# Patient Record
Sex: Female | Born: 1985 | Race: Black or African American | Hispanic: No | Marital: Single | Smoking: Never smoker
Health system: Southern US, Community
[De-identification: ages and names within clinical notes are randomized; demographics above are authoritative.]

## PROBLEM LIST (undated history)

## (undated) HISTORY — PX: LEEP: SHX91

---

## 2013-08-29 ENCOUNTER — Emergency Department (HOSPITAL_BASED_OUTPATIENT_CLINIC_OR_DEPARTMENT_OTHER)
Admission: EM | Admit: 2013-08-29 | Discharge: 2013-08-29 | Disposition: A | Discharge status: Home / Self Care | Payer: Medicaid Other | Attending: Emergency Medicine | Admitting: Emergency Medicine

## 2013-08-29 ENCOUNTER — Encounter (HOSPITAL_BASED_OUTPATIENT_CLINIC_OR_DEPARTMENT_OTHER): Payer: Self-pay | Admitting: Emergency Medicine

## 2013-08-29 DIAGNOSIS — R1032 Left lower quadrant pain: Secondary | ICD-10-CM | POA: Insufficient documentation

## 2013-08-29 DIAGNOSIS — Z3202 Encounter for pregnancy test, result negative: Secondary | ICD-10-CM | POA: Insufficient documentation

## 2013-08-29 DIAGNOSIS — R1110 Vomiting, unspecified: Secondary | ICD-10-CM | POA: Diagnosis present

## 2013-08-29 DIAGNOSIS — N390 Urinary tract infection, site not specified: Secondary | ICD-10-CM | POA: Diagnosis present

## 2013-08-29 DIAGNOSIS — R51 Headache: Secondary | ICD-10-CM | POA: Insufficient documentation

## 2013-08-29 LAB — URINALYSIS, ROUTINE W REFLEX MICROSCOPIC
Glucose, UA: NEGATIVE mg/dL
Ketones, ur: NEGATIVE mg/dL
Nitrite: NEGATIVE
Protein, ur: NEGATIVE mg/dL
Specific Gravity, Urine: 1.023 (ref 1.005–1.030)
Urobilinogen, UA: 0.2 mg/dL (ref 0.0–1.0)

## 2013-08-29 LAB — CBC WITH DIFFERENTIAL/PLATELET
Basophils Relative: 0 % (ref 0–1)
HCT: 41 % (ref 36.0–46.0)
Hemoglobin: 13.7 g/dL (ref 12.0–15.0)
Lymphocytes Relative: 23 % (ref 12–46)
MCHC: 33.4 g/dL (ref 30.0–36.0)
Monocytes Absolute: 0.5 10*3/uL (ref 0.1–1.0)
Monocytes Relative: 6 % (ref 3–12)
Neutro Abs: 5.1 10*3/uL (ref 1.7–7.7)
Neutrophils Relative %: 69 % (ref 43–77)
RBC: 4.52 MIL/uL (ref 3.87–5.11)
WBC: 7.4 10*3/uL (ref 4.0–10.5)

## 2013-08-29 LAB — PREGNANCY, URINE: Preg Test, Ur: NEGATIVE

## 2013-08-29 LAB — URINE MICROSCOPIC-ADD ON

## 2013-08-29 LAB — COMPREHENSIVE METABOLIC PANEL
ALT: 10 U/L (ref 0–35)
AST: 16 U/L (ref 0–37)
Albumin: 4.5 g/dL (ref 3.5–5.2)
Alkaline Phosphatase: 58 U/L (ref 39–117)
BUN: 13 mg/dL (ref 6–23)
CO2: 28 mEq/L (ref 19–32)
Chloride: 104 mEq/L (ref 96–112)
Creatinine, Ser: 0.7 mg/dL (ref 0.50–1.10)
GFR calc non Af Amer: >90 mL/min (ref >90)
Potassium: 4.1 mEq/L (ref 3.5–5.1)
Sodium: 141 mEq/L (ref 135–145)
Total Bilirubin: 0.4 mg/dL (ref 0.3–1.2)

## 2013-08-29 MED ORDER — KETOROLAC TROMETHAMINE 30 MG/ML IJ SOLN
30.0000 mg | Freq: Once | INTRAMUSCULAR | Status: AC
  Administered 2013-08-29: 30 mg via INTRAVENOUS
  Filled 2013-08-29: qty 1

## 2013-08-29 MED ORDER — CIPROFLOXACIN HCL 500 MG PO TABS
500.0000 mg | ORAL_TABLET | Freq: Once | ORAL | Status: AC
  Administered 2013-08-29: 500 mg via ORAL
  Filled 2013-08-29: qty 1

## 2013-08-29 MED ORDER — ONDANSETRON 4 MG PO TBDP: ORAL_TABLET | ORAL | Status: DC

## 2013-08-29 MED ORDER — CIPROFLOXACIN HCL 500 MG PO TABS: 500.0000 mg | ORAL_TABLET | Freq: Two times a day (BID) | ORAL | Status: DC

## 2013-08-29 MED ORDER — DIPHENHYDRAMINE HCL 50 MG/ML IJ SOLN
25.0000 mg | Freq: Once | INTRAMUSCULAR | Status: AC
  Administered 2013-08-29: 25 mg via INTRAVENOUS
  Filled 2013-08-29: qty 1

## 2013-08-29 MED ORDER — SODIUM CHLORIDE 0.9 % IV BOLUS (SEPSIS)
1000.0000 mL | INTRAVENOUS | Status: AC
  Administered 2013-08-29: 1000 mL via INTRAVENOUS

## 2013-08-29 MED ORDER — METOCLOPRAMIDE HCL 5 MG/ML IJ SOLN
5.0000 mg | Freq: Once | INTRAMUSCULAR | Status: AC
  Administered 2013-08-29: 5 mg via INTRAVENOUS
  Filled 2013-08-29: qty 2

## 2013-08-29 NOTE — ED Notes (Signed)
Patient states she developed nausea and vomiting this morning at 1000.  C/O LLQ abdominal pain.

## 2013-08-29 NOTE — ED Provider Notes (Signed)
CSN: [id number]     Arrival date & time 08/29/13  1214 History   First MD Initiated Contact with Patient 08/29/13 1238     Chief Complaint  Patient presents with  . Emesis   (Consider location/radiation/quality/duration/timing/severity/associated sxs/prior Treatment) Patient is a 27 y.o. female presenting with vomiting. The history is provided by the patient.  Emesis Severity:  Mild Duration:  4 hours Timing:  Sporadic Quality:  Stomach contents Progression:  Partially resolved Chronicity:  New Relieved by:  Nothing Worsened by:  Nothing tried Ineffective treatments:  None tried Associated symptoms: abdominal pain and headaches   Associated symptoms: no diarrhea and no fever     History reviewed. No pertinent past medical history. Past Surgical History  Procedure Laterality Date  . Leep     No family history on file. History  Substance Use Topics  . Smoking status: Never Smoker   . Smokeless tobacco: Never Used  . Alcohol Use: No   OB History   Grav Para Term Preterm Abortions TAB SAB Ect Mult Living                 Review of Systems  Constitutional: Negative for fever and fatigue.  HENT: Negative for congestion and drooling.   Eyes: Negative for pain.  Respiratory: Negative for cough and shortness of breath.   Cardiovascular: Negative for chest pain.  Gastrointestinal: Positive for nausea, vomiting and abdominal pain. Negative for diarrhea.  Genitourinary: Negative for dysuria and hematuria.  Musculoskeletal: Negative for back pain, gait problem and neck pain.  Skin: Negative for color change.  Neurological: Positive for headaches. Negative for dizziness.  Hematological: Negative for adenopathy.  Psychiatric/Behavioral: Negative for behavioral problems.  All other systems reviewed and are negative.    Allergies  Review of patient's allergies indicates no known allergies.  Home Medications   Current Outpatient Rx  Name  Route  Sig  Dispense  Refill  .  levonorgestrel (MIRENA) 20 MCG/24HR IUD   Intrauterine   1 each by Intrauterine route once.          BP 126/73  Pulse 66  Temp(Src) 97.8 F (36.6 C) (Oral)  Resp 16  Ht 5\' 5"  (1.651 m)  Wt 220 lb (99.791 kg)  BMI 36.61 kg/m2  SpO2 100%  LMP 08/22/2013 Physical Exam  Nursing note and vitals reviewed. Constitutional: She is oriented to person, place, and time. She appears well-developed and well-nourished.  HENT:  Head: Normocephalic.  Mouth/Throat: Oropharynx is clear and moist. No oropharyngeal exudate.  Eyes: Conjunctivae and EOM are normal. Pupils are equal, round, and reactive to light.  Neck: Normal range of motion. Neck supple.  Cardiovascular: Normal rate, regular rhythm, normal heart sounds and intact distal pulses.  Exam reveals no gallop and no friction rub.   No murmur heard. Pulmonary/Chest: Effort normal and breath sounds normal. No respiratory distress. She has no wheezes.  Abdominal: Soft. Bowel sounds are normal. There is no tenderness. There is no rebound and no guarding.  Musculoskeletal: Normal range of motion. She exhibits no edema and no tenderness.  Neurological: She is alert and oriented to person, place, and time.  Skin: Skin is warm and dry.  Psychiatric: She has a normal mood and affect. Her behavior is normal.    ED Course  Procedures (including critical care time) Labs Review Labs Reviewed  URINALYSIS, ROUTINE W REFLEX MICROSCOPIC - Abnormal; Notable for the following:    APPearance CLOUDY (*)    Hgb urine dipstick LARGE (*)  Leukocytes, UA MODERATE (*)    All other components within normal limits  COMPREHENSIVE METABOLIC PANEL - Abnormal; Notable for the following:    Glucose, Bld 100 (*)    All other components within normal limits  URINE MICROSCOPIC-ADD ON - Abnormal; Notable for the following:    Bacteria, UA MANY (*)    All other components within normal limits  URINE CULTURE  PREGNANCY, URINE  CBC WITH DIFFERENTIAL   Imaging  Review No results found.  EKG Interpretation   None       MDM   1. UTI (lower urinary tract infection)   2. Vomiting    12:53 PM 27 y.o. female who presents with nausea, vomiting, and mild left lower quadrant pain for the last 3 hours. She notes that the left lower quadrant pain is intermittent and she is currently not having this on exam. She denies any fevers or diarrhea. She is afebrile and vital signs are unremarkable here. She states that she has had some mild vaginal spotting in the last week, she uses a Mirena for birth control. This was recently replaced approximately 5 months ago. She also notes mild dysuria for the last week. She also states she developed a mild headache on her way to the ER. Will get screening labs, IV fluid, and migraine cocktail for headache.  3:02 PM: Pt feeling much better on exam. Its possible her sx are assoc w/ early pyelonephritis given the vomiting. As she appears well and is tolerating po I think it is reasonable to tx as an outpt.  I have discussed the diagnosis/risks/treatment options with the patient and believe the pt to be eligible for discharge home to follow-up with pcp as needed. We also discussed returning to the ED immediately if new or worsening sx occur. We discussed the sx which are most concerning (e.g., return of pain, inability to tolerate abx) that necessitate immediate return. Any new prescriptions provided to the patient are listed below.  New Prescriptions   CIPROFLOXACIN (CIPRO) 500 MG TABLET    Take 1 tablet (500 mg total) by mouth 2 (two) times daily. One po bid x 7 days   ONDANSETRON (ZOFRAN ODT) 4 MG DISINTEGRATING TABLET    4mg  ODT q4 hours prn nausea/vomit      Junius Argyle, MD 08/29/13 1503

## 2013-08-31 LAB — URINE CULTURE

## 2013-09-01 ENCOUNTER — Telehealth (HOSPITAL_COMMUNITY): Payer: Self-pay | Admitting: Emergency Medicine

## 2013-09-01 NOTE — ED Notes (Signed)
Post ED Visit - Positive Culture Follow-up  Culture report reviewed by antimicrobial stewardship pharmacist: []  Wes Dulaney, Pharm.D., BCPS []  Celedonio Miyamoto, [address].D., BCPS [x]  Georgina Pillion, Pharm.D., BCPS []  [place], [address].D., BCPS, AAHIVP []  Estella Husk, Pharm.D., BCPS, AAHIVP  Positive urine culture Treated with Cipro, organism sensitive to the same and no further patient follow-up is required at this time.  Kylie A Holland 09/01/2013, 4:42 PM

## 2015-03-07 ENCOUNTER — Emergency Department (HOSPITAL_BASED_OUTPATIENT_CLINIC_OR_DEPARTMENT_OTHER)
Admission: EM | Admit: 2015-03-07 | Discharge: 2015-03-07 | Disposition: A | Discharge status: Home / Self Care | Payer: Medicaid Other | Attending: Emergency Medicine | Admitting: Emergency Medicine

## 2015-03-07 ENCOUNTER — Encounter (HOSPITAL_BASED_OUTPATIENT_CLINIC_OR_DEPARTMENT_OTHER): Payer: Self-pay

## 2015-03-07 DIAGNOSIS — Y939 Activity, unspecified: Secondary | ICD-10-CM | POA: Diagnosis not present

## 2015-03-07 DIAGNOSIS — R221 Localized swelling, mass and lump, neck: Secondary | ICD-10-CM | POA: Diagnosis present

## 2015-03-07 DIAGNOSIS — W57XXXA Bitten or stung by nonvenomous insect and other nonvenomous arthropods, initial encounter: Secondary | ICD-10-CM | POA: Diagnosis not present

## 2015-03-07 DIAGNOSIS — Y929 Unspecified place or not applicable: Secondary | ICD-10-CM | POA: Insufficient documentation

## 2015-03-07 DIAGNOSIS — S1086XA Insect bite of other specified part of neck, initial encounter: Secondary | ICD-10-CM | POA: Diagnosis not present

## 2015-03-07 DIAGNOSIS — Y999 Unspecified external cause status: Secondary | ICD-10-CM | POA: Diagnosis not present

## 2015-03-07 DIAGNOSIS — Z792 Long term (current) use of antibiotics: Secondary | ICD-10-CM | POA: Insufficient documentation

## 2015-03-07 DIAGNOSIS — S1096XA Insect bite of unspecified part of neck, initial encounter: Secondary | ICD-10-CM

## 2015-03-07 MED ORDER — MUPIROCIN CALCIUM 2 % EX CREA: 1.0000 "application " | TOPICAL_CREAM | Freq: Three times a day (TID) | CUTANEOUS | Status: DC

## 2015-03-07 NOTE — Discharge Instructions (Signed)

## 2015-03-07 NOTE — ED Notes (Signed)
Pt reports last night noted swelling in L side of neck, reports mild tenderness to same.

## 2015-03-07 NOTE — ED Provider Notes (Signed)
CSN: [id number]     Arrival date & time 03/07/15  9528 History   First MD Initiated Contact with Patient 03/07/15 [phone]     Chief Complaint  Patient presents with  . Mass     (Consider location/radiation/quality/duration/timing/severity/associated sxs/prior Treatment) HPI Comments: Patient noticed some swelling to her left neck behind her left ear. She noticed it last night. She says it's a little sore to touch. She also noticed a little bump on the left side of her neck. She's not sure how she got it. She denies any toothaches. She denies any ear pain. She denies any sore throat. She denies any URI symptoms. She has some mild nausea but says this is normal for her. She's having regular periods at the last one being May 20. She states she has an IUD in place and does not feel that there is any chance of her being pregnant. She denies abdominal pain. She has mild intermittent headaches which is normal for her.   History reviewed. No pertinent past medical history. Past Surgical History  Procedure Laterality Date  . Leep     No family history on file. History  Substance Use Topics  . Smoking status: Never Smoker   . Smokeless tobacco: Never Used  . Alcohol Use: No   OB History    No data available     Review of Systems  Constitutional: Negative for fever, chills, diaphoresis and fatigue.  HENT: Negative for congestion, rhinorrhea and sneezing.   Eyes: Negative.   Respiratory: Negative for cough, chest tightness and shortness of breath.   Cardiovascular: Negative for chest pain and leg swelling.  Gastrointestinal: Negative for nausea, vomiting, abdominal pain, diarrhea and blood in stool.  Genitourinary: Negative for frequency, hematuria, flank pain and difficulty urinating.  Musculoskeletal: Negative for back pain and arthralgias.  Skin: Positive for wound. Negative for rash.  Neurological: Negative for dizziness, speech difficulty, weakness, numbness and headaches.   Hematological: Positive for adenopathy.      Allergies  Review of patient's allergies indicates no known allergies.  Home Medications   Prior to Admission medications   Medication Sig Start Date End Date Taking? Authorizing Provider  ciprofloxacin (CIPRO) 500 MG tablet Take 1 tablet (500 mg total) by mouth 2 (two) times daily. One po bid x 7 days 08/29/13   Purvis Sheffield, MD  levonorgestrel (MIRENA) 20 MCG/24HR IUD 1 each by Intrauterine route once.    Historical Provider, MD  mupirocin cream (BACTROBAN) 2 % Apply 1 application topically 3 (three) times daily. For 7 days 03/07/15   Rolan Bucco, MD  ondansetron (ZOFRAN ODT) 4 MG disintegrating tablet 4mg  ODT q4 hours prn nausea/vomit 08/29/13   Purvis Sheffield, MD   BP 111/67 mmHg  Pulse 63  Temp(Src) 98.4 F (36.9 C) (Oral)  Resp 18  Ht 5\' 5"  (1.651 m)  Wt 200 lb (90.719 kg)  BMI 33.28 kg/m2  SpO2 97%  LMP 02/14/2015 Physical Exam  Constitutional: She is oriented to person, place, and time. She appears well-developed and well-nourished.  HENT:  Head: Normocephalic and atraumatic.  Right Ear: External ear normal.  Left Ear: External ear normal.  Mouth/Throat: Oropharynx is clear and moist.  Pt with small, 1cm erythematous area to left neck with small black mark to center of area.  No swelling, no induration or fluctuance.  Few enlarged posterior auricular nodes on left  Eyes: Pupils are equal, round, and reactive to light.  Neck: Normal range of motion. Neck supple.  Cardiovascular: Normal  rate, regular rhythm and normal heart sounds.   Pulmonary/Chest: Effort normal and breath sounds normal. No respiratory distress. She has no wheezes. She has no rales. She exhibits no tenderness.  Abdominal: Soft. Bowel sounds are normal. There is no tenderness. There is no rebound and no guarding.  Musculoskeletal: Normal range of motion. She exhibits no edema.  Lymphadenopathy:    She has no cervical adenopathy.  Neurological: She  is alert and oriented to person, place, and time.  Skin: Skin is warm and dry. No rash noted.  Psychiatric: She has a normal mood and affect.    ED Course  Procedures (including critical care time) Labs Review Labs Reviewed - No data to display  Imaging Review No results found.   EKG Interpretation None      MDM   Final diagnoses:  Insect bite of neck with local reaction, initial encounter    Patient has a small area of redness to her left neck. There is no swelling or signs of an abscess. There is a small wound in the middle of the reddened area. I feel this is likely an insect bite and could have some surrounding infection. She has some subtle left posterior auricular lymphadenopathy which is the swelling that she's feeling. There is no other signs of infection. She does not want a pregnancy test and feels that there is no chance of her being pregnant. She was started on Bactroban ointment. She was advised to return if her symptoms worsen.    Rolan Bucco, MD 03/07/15 [phone]

## 2015-03-07 NOTE — ED Notes (Signed)
MD at bedside. 

## 2015-07-10 ENCOUNTER — Encounter (HOSPITAL_BASED_OUTPATIENT_CLINIC_OR_DEPARTMENT_OTHER): Payer: Self-pay

## 2015-07-10 ENCOUNTER — Emergency Department (HOSPITAL_BASED_OUTPATIENT_CLINIC_OR_DEPARTMENT_OTHER)
Admission: EM | Admit: 2015-07-10 | Discharge: 2015-07-10 | Disposition: A | Discharge status: Home / Self Care | Payer: Medicaid Other | Attending: Emergency Medicine | Admitting: Emergency Medicine

## 2015-07-10 DIAGNOSIS — N938 Other specified abnormal uterine and vaginal bleeding: Secondary | ICD-10-CM | POA: Insufficient documentation

## 2015-07-10 DIAGNOSIS — Z3202 Encounter for pregnancy test, result negative: Secondary | ICD-10-CM | POA: Insufficient documentation

## 2015-07-10 DIAGNOSIS — N390 Urinary tract infection, site not specified: Secondary | ICD-10-CM | POA: Insufficient documentation

## 2015-07-10 LAB — PREGNANCY, URINE: PREG TEST UR: NEGATIVE

## 2015-07-10 LAB — URINALYSIS, ROUTINE W REFLEX MICROSCOPIC
BILIRUBIN URINE: NEGATIVE
Glucose, UA: NEGATIVE mg/dL
KETONES UR: NEGATIVE mg/dL
Nitrite: NEGATIVE
Protein, ur: NEGATIVE mg/dL
Specific Gravity, Urine: 1.027 (ref 1.005–1.030)
Urobilinogen, UA: 1 mg/dL (ref 0.0–1.0)
pH: 5.5 (ref 5.0–8.0)

## 2015-07-10 LAB — URINE MICROSCOPIC-ADD ON

## 2015-07-10 MED ORDER — PHENAZOPYRIDINE HCL 200 MG PO TABS: 200.0000 mg | ORAL_TABLET | Freq: Three times a day (TID) | ORAL | Status: AC | PRN

## 2015-07-10 MED ORDER — FOSFOMYCIN TROMETHAMINE 3 G PO PACK
3.0000 g | PACK | Freq: Once | ORAL | Status: AC
  Administered 2015-07-10: 3 g via ORAL
  Filled 2015-07-10: qty 3

## 2015-07-10 NOTE — ED Notes (Signed)
C/o dysuria and urgency also dark brown vaginal bleeding with clots

## 2015-07-10 NOTE — ED Provider Notes (Signed)
CSN: [id number]     Arrival date & time 07/10/15  1515 History   First MD Initiated Contact with Patient 07/10/15 1524     Chief Complaint  Patient presents with  . Dysuria     (Consider location/radiation/quality/duration/timing/severity/associated sxs/prior Treatment) Patient is a 29 y.o. female presenting with dysuria. The history is provided by the patient.  Dysuria Pain quality:  Unable to specify Pain severity:  Mild Onset quality:  Sudden Duration:  2 days Timing:  Constant Progression:  Worsening Chronicity:  Recurrent Recent urinary tract infections: no   Relieved by:  Nothing Worsened by:  Nothing tried Ineffective treatments:  None tried Urinary symptoms: frequent urination and hesitancy   Associated symptoms: no fever, no nausea and no vomiting   Risk factors: no hx of pyelonephritis and not pregnant     29 yo F with a chief complaint of urinary frequency. This started couple days ago. Also with hesitancy as well. Patient states that she is also had some mild dark vaginal bleeding. This been going on for about the same time. Patient denies pelvic pain cramping. Denies back pain fevers. Denies hematuria.  History reviewed. No pertinent past medical history. Past Surgical History  Procedure Laterality Date  . Leep     No family history on file. Social History  Substance Use Topics  . Smoking status: Never Smoker   . Smokeless tobacco: Never Used  . Alcohol Use: No   OB History    No data available     Review of Systems  Constitutional: Negative for fever and chills.  HENT: Negative for congestion and rhinorrhea.   Eyes: Negative for redness and visual disturbance.  Respiratory: Negative for shortness of breath and wheezing.   Cardiovascular: Negative for chest pain and palpitations.  Gastrointestinal: Negative for nausea and vomiting.  Genitourinary: Positive for vaginal bleeding (spotting x1 day). Negative for dysuria and urgency.  Musculoskeletal:  Negative for myalgias and arthralgias.  Skin: Negative for pallor and wound.  Neurological: Negative for dizziness and headaches.      Allergies  Review of patient's allergies indicates no known allergies.  Home Medications   Prior to Admission medications   Medication Sig Start Date End Date Taking? Authorizing Provider  levonorgestrel (MIRENA) 20 MCG/24HR IUD 1 each by Intrauterine route once.    Historical Provider, MD   BP 133/68 mmHg  Pulse 59  Temp(Src) 98.8 F (37.1 C) (Oral)  Resp 16  Ht  (1.626 m)  Wt 200 lb (90.719 kg)  BMI 34.31 kg/m2  SpO2 100%  LMP 07/10/2015 Physical Exam  Constitutional: She is oriented to person, place, and time. She appears well-developed and well-nourished. No distress.  HENT:  Head: Normocephalic and atraumatic.  Eyes: EOM are normal. Pupils are equal, round, and reactive to light.  Neck: Normal range of motion. Neck supple.  Cardiovascular: Normal rate and regular rhythm.  Exam reveals no gallop and no friction rub.   No murmur heard. Pulmonary/Chest: Effort normal. She has no wheezes. She has no rales.  Abdominal: Soft. She exhibits no distension. There is tenderness (mild suprapubic). There is no rebound and no guarding.  Musculoskeletal: She exhibits no edema or tenderness.  Neurological: She is alert and oriented to person, place, and time.  Skin: Skin is warm and dry. She is not diaphoretic.  Psychiatric: She has a normal mood and affect. Her behavior is normal.    ED Course  Procedures (including critical care time) Labs Review Labs Reviewed  URINALYSIS, ROUTINE  W REFLEX MICROSCOPIC (NOT AT [facility]) - Abnormal; Notable for the following:    APPearance CLOUDY (*)    Hgb urine dipstick MODERATE (*)    Leukocytes, UA MODERATE (*)    All other components within normal limits  URINE MICROSCOPIC-ADD ON - Abnormal; Notable for the following:    Squamous Epithelial / LPF FEW (*)    All other components within normal limits   PREGNANCY, URINE    Imaging Review No results found. I have personally reviewed and evaluated these images and lab results as part of my medical decision-making.   EKG Interpretation None      MDM   Final diagnoses:  None    29 yo F with a chief complaint of urinary hesitancy and increased frequency. Patient found to have UTI on UA. Will treat with fosfomycin. Disc home.  4:16 PM:  I have discussed the diagnosis/risks/treatment options with the patient and believe the pt to be eligible for discharge home to follow-up with PCP. We also discussed returning to the ED immediately if new or worsening sx occur. We discussed the sx which are most concerning (e.g., sudden worsening pain, fever, inability to tolerate by mouth) that necessitate immediate return. Medications administered to the patient during their visit and any new prescriptions provided to the patient are listed below.  Medications given during this visit Medications  fosfomycin (MONUROL) packet 3 g (not administered)    New Prescriptions   No medications on file    The patient appears reasonably screen and/or stabilized for discharge and I doubt any other medical condition or other [facility] requiring further screening, evaluation, or treatment in the ED at this time prior to discharge.      Melene Planan Audre Cenci, DO 07/10/15 1616

## 2015-07-10 NOTE — Discharge Instructions (Signed)

## 2016-10-10 ENCOUNTER — Emergency Department (HOSPITAL_BASED_OUTPATIENT_CLINIC_OR_DEPARTMENT_OTHER)
Admission: EM | Admit: 2016-10-10 | Discharge: 2016-10-10 | Disposition: A | Discharge status: Home / Self Care | Payer: Medicaid Other | Attending: Emergency Medicine | Admitting: Emergency Medicine

## 2016-10-10 ENCOUNTER — Encounter (HOSPITAL_BASED_OUTPATIENT_CLINIC_OR_DEPARTMENT_OTHER): Payer: Self-pay | Admitting: Emergency Medicine

## 2016-10-10 DIAGNOSIS — O99613 Diseases of the digestive system complicating pregnancy, third trimester: Secondary | ICD-10-CM | POA: Insufficient documentation

## 2016-10-10 DIAGNOSIS — K529 Noninfective gastroenteritis and colitis, unspecified: Secondary | ICD-10-CM | POA: Insufficient documentation

## 2016-10-10 DIAGNOSIS — Z3A31 31 weeks gestation of pregnancy: Secondary | ICD-10-CM | POA: Insufficient documentation

## 2016-10-10 DIAGNOSIS — O26893 Other specified pregnancy related conditions, third trimester: Secondary | ICD-10-CM | POA: Diagnosis present

## 2016-10-10 LAB — URINALYSIS, ROUTINE W REFLEX MICROSCOPIC
Bilirubin Urine: NEGATIVE
Glucose, UA: NEGATIVE mg/dL
Ketones, ur: 15 mg/dL — AB
Leukocytes, UA: NEGATIVE
NITRITE: NEGATIVE
Protein, ur: NEGATIVE mg/dL
SPECIFIC GRAVITY, URINE: 1.017 (ref 1.005–1.030)
pH: 6.5 (ref 5.0–8.0)

## 2016-10-10 LAB — COMPREHENSIVE METABOLIC PANEL
ALT: 12 U/L — ABNORMAL LOW (ref 14–54)
AST: 17 U/L (ref 15–41)
Albumin: 3.3 g/dL — ABNORMAL LOW (ref 3.5–5.0)
Alkaline Phosphatase: 78 U/L (ref 38–126)
Anion gap: 7 (ref 5–15)
BUN: <5 mg/dL — ABNORMAL LOW (ref 6–20)
CO2: 24 mmol/L (ref 22–32)
Calcium: 8.8 mg/dL — ABNORMAL LOW (ref 8.9–10.3)
Chloride: 104 mmol/L (ref 101–111)
Creatinine, Ser: 0.41 mg/dL — ABNORMAL LOW (ref 0.44–1.00)
GFR calc Af Amer: >60 mL/min (ref >60)
GFR calc non Af Amer: >60 mL/min (ref >60)
GLUCOSE: 77 mg/dL (ref 65–99)
POTASSIUM: 3.2 mmol/L — AB (ref 3.5–5.1)
SODIUM: 135 mmol/L (ref 135–145)
Total Bilirubin: 0.6 mg/dL (ref 0.3–1.2)
Total Protein: 6.7 g/dL (ref 6.5–8.1)

## 2016-10-10 LAB — CBC
HEMATOCRIT: 37.3 % (ref 36.0–46.0)
Hemoglobin: 12.1 g/dL (ref 12.0–15.0)
MCH: 29.2 pg (ref 26.0–34.0)
MCHC: 32.4 g/dL (ref 30.0–36.0)
MCV: 90.1 fL (ref 78.0–100.0)
Platelets: 166 10*3/uL (ref 150–400)
RBC: 4.14 MIL/uL (ref 3.87–5.11)
RDW: 12.8 % (ref 11.5–15.5)
WBC: 9.5 10*3/uL (ref 4.0–10.5)

## 2016-10-10 LAB — URINALYSIS, MICROSCOPIC (REFLEX)

## 2016-10-10 LAB — LIPASE, BLOOD: Lipase: 17 U/L (ref 11–51)

## 2016-10-10 MED ORDER — METOCLOPRAMIDE HCL 10 MG PO TABS: 10.0000 mg | ORAL_TABLET | Freq: Three times a day (TID) | ORAL | 0 refills | Status: AC | PRN

## 2016-10-10 MED ORDER — POTASSIUM CHLORIDE CRYS ER 20 MEQ PO TBCR
40.0000 meq | EXTENDED_RELEASE_TABLET | Freq: Once | ORAL | Status: AC
  Administered 2016-10-10: 40 meq via ORAL
  Filled 2016-10-10: qty 2

## 2016-10-10 MED ORDER — SODIUM CHLORIDE 0.9 % IV BOLUS (SEPSIS)
1000.0000 mL | Freq: Once | INTRAVENOUS | Status: AC
  Administered 2016-10-10: 1000 mL via INTRAVENOUS

## 2016-10-10 NOTE — ED Notes (Signed)
Rapid response RN advises pt is able to come off the toco.

## 2016-10-10 NOTE — ED Provider Notes (Addendum)
[facility] Provider Note   CSN: [id number] Arrival date & time: 10/10/16  1504   By signing my name below, I, Kathy Holloway, attest that this documentation has been prepared under the direction and in the presence of Kathy SouSam Trelyn Vanderlinde, MD  Electronically Signed: Clovis PuAvnee Holloway, ED Scribe. 10/10/16. 4:03 PM.   History   Chief Complaint Chief Complaint  Patient presents with  . Abdominal Pain  . Emesis During Pregnancy    The history is provided by the patient. No language interpreter was used.   HPI Comments:  Kathy Holloway is a 31 y.o. female who presents to the Emergency Department complaining of  intermittent Crampy lower abdominal pain every 15 to 20 minutes x today. She notes her pain last for about 5 minutes. She  also reports 5 episodes of diarrhea and vomiting. Pt denies fevers, no nausea present. No urinary symptoms. Discomfort does not feel like uterine contractions, smoking, alcohol use, drug use, allergies to medications. Pt is [redacted] weeks pregnant and her due date is 12/09/16. [place]   History reviewed. No pertinent past medical history.  Patient Active Problem List   Diagnosis Date Noted  . UTI (lower urinary tract infection) 08/29/2013  . Vomiting 08/29/2013    Past Surgical History:  Procedure Laterality Date  . LEEP      OB History    Gravida Para Term Preterm AB Living   3 1   1 1 1    SAB TAB Ectopic Multiple Live Births                   Home Medications    Prior to Admission medications   Medication Sig Start Date End Date Taking? Authorizing Provider  phenazopyridine (PYRIDIUM) 200 MG tablet Take 1 tablet (200 mg total) by mouth 3 (three) times daily as needed for pain. 07/10/15   Melene Planan Floyd, DO    Family History No family history on file.  Social History Social History  Substance Use Topics  . Smoking status: Never Smoker  . Smokeless tobacco: Never Used  . Alcohol use No     Allergies   Patient has no  known allergies.   Review of Systems Review of Systems  Constitutional: Negative.  Negative for fever.  HENT: Negative.   Respiratory: Negative.   Cardiovascular: Negative.   Gastrointestinal: Positive for abdominal pain, diarrhea and vomiting.  Genitourinary:       Pregnant  Musculoskeletal: Negative.   Skin: Negative.   Neurological: Negative.   Psychiatric/Behavioral: Negative.   All other systems reviewed and are negative.    Physical Exam Updated Vital Signs BP 129/85 (BP Location: Right Arm)   Pulse 88   Temp 97.8 F (36.6 C) (Oral)   Resp 18   Ht 5\' 4"  (1.626 m)   Wt 245 lb (111.1 kg)   LMP 03/05/2016 (Exact Date)   SpO2 100%   BMI 42.05 kg/m   Physical Exam  Constitutional: She is oriented to person, place, and time. She appears well-developed and well-nourished.  HENT:  Head: Normocephalic and atraumatic.  Eyes: Conjunctivae are normal. Pupils are equal, round, and reactive to light.  Neck: Neck supple. No tracheal deviation present. No thyromegaly present.  Cardiovascular: Normal rate and regular rhythm.   No murmur heard. Pulmonary/Chest: Effort normal and breath sounds normal.  Abdominal: Soft. Bowel sounds are normal. She exhibits no distension. There is no tenderness.  Gravid fetal heart tones 140s  Musculoskeletal: Normal range of  motion. She exhibits no edema or tenderness.  Neurological: She is alert and oriented to person, place, and time. Coordination normal.  Skin: Skin is warm and dry. No rash noted.  Psychiatric: She has a normal mood and affect.  Nursing note and vitals reviewed.    ED Treatments / Results  DIAGNOSTIC STUDIES:  Oxygen Saturation is 100% on RA, normal by my interpretation.    COORDINATION OF CARE:  4:02 PM Discussed treatment plan with pt at bedside and pt agreed to plan.  Labs (all labs ordered are listed, but only abnormal results are displayed) Labs Reviewed  LIPASE, BLOOD  COMPREHENSIVE METABOLIC PANEL  CBC    URINALYSIS, ROUTINE W REFLEX MICROSCOPIC   Results for orders placed or performed during the hospital encounter of 10/10/16  Lipase, blood  Result Value Ref Range   Lipase 17 11 - 51 U/L  Comprehensive metabolic panel  Result Value Ref Range   Sodium 135 135 - 145 mmol/L   Potassium 3.2 (L) 3.5 - 5.1 mmol/L   Chloride 104 101 - 111 mmol/L   CO2 24 22 - 32 mmol/L   Glucose, Bld 77 65 - 99 mg/dL   BUN <5 (L) 6 - 20 mg/dL   Creatinine, Ser 1.47 (L) 0.44 - 1.00 mg/dL   Calcium 8.8 (L) 8.9 - 10.3 mg/dL   Total Protein 6.7 6.5 - 8.1 g/dL   Albumin 3.3 (L) 3.5 - 5.0 g/dL   AST 17 15 - 41 U/L   ALT 12 (L) 14 - 54 U/L   Alkaline Phosphatase 78 38 - 126 U/L   Total Bilirubin 0.6 0.3 - 1.2 mg/dL   GFR calc non Af Amer >60 >60 mL/min   GFR calc Af Amer >60 >60 mL/min   Anion gap 7 5 - 15  CBC  Result Value Ref Range   WBC 9.5 4.0 - 10.5 K/uL   RBC 4.14 3.87 - 5.11 MIL/uL   Hemoglobin 12.1 12.0 - 15.0 g/dL   HCT 82.9 56.2 - 13.0 %   MCV 90.1 78.0 - 100.0 fL   MCH 29.2 26.0 - 34.0 pg   MCHC 32.4 30.0 - 36.0 g/dL   RDW 86.5 78.4 - 69.6 %   Platelets 166 150 - 400 K/uL  Urinalysis, Routine w reflex microscopic  Result Value Ref Range   Color, Urine YELLOW YELLOW   APPearance CLOUDY (A) CLEAR   Specific Gravity, Urine 1.017 1.005 - 1.030   pH 6.5 5.0 - 8.0   Glucose, UA NEGATIVE NEGATIVE mg/dL   Hgb urine dipstick SMALL (A) NEGATIVE   Bilirubin Urine NEGATIVE NEGATIVE   Ketones, ur 15 (A) NEGATIVE mg/dL   Protein, ur NEGATIVE NEGATIVE mg/dL   Nitrite NEGATIVE NEGATIVE   Leukocytes, UA NEGATIVE NEGATIVE  Urinalysis, Microscopic (reflex)  Result Value Ref Range   RBC / HPF 0-5 0 - 5 RBC/hpf   WBC, UA 0-5 0 - 5 WBC/hpf   Bacteria, UA FEW (A) NONE SEEN   Squamous Epithelial / LPF 6-30 (A) NONE SEEN   No results found. EKG  EKG Interpretation None       Radiology No results found.  Procedures Procedures (including critical care time)  Medications Ordered in  ED Medications  sodium chloride 0.9 % bolus 1,000 mL (not administered)     Initial Impression / Assessment and Plan / ED Course  I have reviewed the triage vital signs and the nursing notes.  Pertinent labs & imaging results that were available during my  care of the patient were reviewed by me and considered in my medical decision making (see chart for details).  Clinical Course   6 PM feels improved after treatment with intravenous fluids. No nausea. She was able to drink ginger ale and crackers. She had no abnormality on toco monitor. Symptoms consistent with gastroenteritis. Received oral potassium supplementation while here Plan encourage hydration. Prescription Reglan. Avoid dairy. Follow up with obstetrician    Final Clinical Impressions(s) / ED Diagnoses   Final diagnoses:  None    New Prescriptions New Prescriptions   No medications on file  Diagnosis #1 gastroenteritis #2 hypokalemia     Kathy Sou, MD 10/10/16 1610    Kathy Sou, MD 10/10/16 [id number]

## 2016-10-10 NOTE — Progress Notes (Signed)
Received call from [facility]. Pt is a G3P1 at 31 weeks with c/o nausea, vomiting, diarreaha. No vaginal bleeding or leaking of fluid.Pt gets her care in [place].

## 2016-10-10 NOTE — ED Notes (Signed)
Pt drinking ginger ale and eating saltines. 

## 2016-10-10 NOTE — Progress Notes (Signed)
Spoke with [facility] RN. FHR tracing intermittently. Says she will adjust the fetal monitor.

## 2016-10-10 NOTE — Progress Notes (Signed)
Dr. Despina HiddenEure notified of patient stable OB status with reassuring FHR. Ok for patient to come off monitor. Spoke with Arline Aspindy RN at [facility] that patient needs to keep routine OB appt with her MD.

## 2016-10-10 NOTE — ED Triage Notes (Signed)
Pt states she awoke this morning with lower abdominal pain that "comes and goes" along with nausea, diarrhea and has vomited 5-6x today since.  Pt is [redacted] weeks pregnant.

## 2016-10-10 NOTE — ED Notes (Signed)
Pt given d/c instructions as per chart. Verbalizes understanding. No questions. Rx x 1 

## 2016-10-10 NOTE — ED Notes (Signed)
OB rapid response notified  

## 2016-10-10 NOTE — Discharge Instructions (Signed)
Make sure that you drink at least six 8 ounce glasses of water and Gatorade each day in order to stay well-hydrated. Take the medication prescribed as needed for nausea. Avoid milk or foods containing milk such as cheese or ice cream all having diarrhea. It is okay to take Tylenol as directed for pain. Contact your obstetrician tomorrow for follow-up. He/she may want to see within the office. Return if you're unable to hold down fluids after taking the medication prescribed or if you feel worse for any reason

## 2017-12-16 ENCOUNTER — Encounter (HOSPITAL_BASED_OUTPATIENT_CLINIC_OR_DEPARTMENT_OTHER): Payer: Self-pay | Admitting: Emergency Medicine

## 2017-12-16 ENCOUNTER — Other Ambulatory Visit: Payer: Self-pay

## 2017-12-16 ENCOUNTER — Emergency Department (HOSPITAL_BASED_OUTPATIENT_CLINIC_OR_DEPARTMENT_OTHER)
Admission: EM | Admit: 2017-12-16 | Discharge: 2017-12-16 | Disposition: A | Discharge status: Home / Self Care | Payer: No Typology Code available for payment source | Attending: Emergency Medicine | Admitting: Emergency Medicine

## 2017-12-16 ENCOUNTER — Emergency Department (HOSPITAL_BASED_OUTPATIENT_CLINIC_OR_DEPARTMENT_OTHER): Payer: No Typology Code available for payment source

## 2017-12-16 DIAGNOSIS — Y999 Unspecified external cause status: Secondary | ICD-10-CM | POA: Diagnosis not present

## 2017-12-16 DIAGNOSIS — S39012A Strain of muscle, fascia and tendon of lower back, initial encounter: Secondary | ICD-10-CM | POA: Diagnosis not present

## 2017-12-16 DIAGNOSIS — Y939 Activity, unspecified: Secondary | ICD-10-CM | POA: Insufficient documentation

## 2017-12-16 DIAGNOSIS — Y929 Unspecified place or not applicable: Secondary | ICD-10-CM | POA: Diagnosis not present

## 2017-12-16 DIAGNOSIS — S3992XA Unspecified injury of lower back, initial encounter: Secondary | ICD-10-CM | POA: Diagnosis present

## 2017-12-16 MED ORDER — IBUPROFEN 800 MG PO TABS: 800.0000 mg | ORAL_TABLET | Freq: Three times a day (TID) | ORAL | 0 refills | Status: AC

## 2017-12-16 MED ORDER — KETOROLAC TROMETHAMINE 30 MG/ML IJ SOLN
30.0000 mg | Freq: Once | INTRAMUSCULAR | Status: AC
  Administered 2017-12-16: 30 mg via INTRAMUSCULAR
  Filled 2017-12-16: qty 1

## 2017-12-16 MED ORDER — CYCLOBENZAPRINE HCL 10 MG PO TABS: 10.0000 mg | ORAL_TABLET | Freq: Two times a day (BID) | ORAL | 0 refills | Status: AC | PRN
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
## 2018-02-26 ENCOUNTER — Emergency Department (HOSPITAL_BASED_OUTPATIENT_CLINIC_OR_DEPARTMENT_OTHER): Payer: Medicaid Other

## 2018-02-26 ENCOUNTER — Emergency Department (HOSPITAL_BASED_OUTPATIENT_CLINIC_OR_DEPARTMENT_OTHER)
Admission: EM | Admit: 2018-02-26 | Discharge: 2018-02-26 | Disposition: A | Discharge status: Home / Self Care | Payer: Medicaid Other | Attending: Emergency Medicine | Admitting: Emergency Medicine

## 2018-02-26 ENCOUNTER — Encounter (HOSPITAL_BASED_OUTPATIENT_CLINIC_OR_DEPARTMENT_OTHER): Payer: Self-pay | Admitting: Emergency Medicine

## 2018-02-26 ENCOUNTER — Other Ambulatory Visit: Payer: Self-pay

## 2018-02-26 DIAGNOSIS — Z3A08 8 weeks gestation of pregnancy: Secondary | ICD-10-CM | POA: Diagnosis not present

## 2018-02-26 DIAGNOSIS — R1031 Right lower quadrant pain: Secondary | ICD-10-CM | POA: Diagnosis not present

## 2018-02-26 DIAGNOSIS — O9989 Other specified diseases and conditions complicating pregnancy, childbirth and the puerperium: Secondary | ICD-10-CM | POA: Diagnosis not present

## 2018-02-26 DIAGNOSIS — R42 Dizziness and giddiness: Secondary | ICD-10-CM | POA: Diagnosis not present

## 2018-02-26 DIAGNOSIS — R110 Nausea: Secondary | ICD-10-CM | POA: Diagnosis present

## 2018-02-26 LAB — URINALYSIS, ROUTINE W REFLEX MICROSCOPIC
BILIRUBIN URINE: NEGATIVE
GLUCOSE, UA: NEGATIVE mg/dL
KETONES UR: NEGATIVE mg/dL
Leukocytes, UA: NEGATIVE
NITRITE: NEGATIVE
PH: 8.5 — AB (ref 5.0–8.0)
Protein, ur: NEGATIVE mg/dL
SPECIFIC GRAVITY, URINE: 1.015 (ref 1.005–1.030)

## 2018-02-26 LAB — URINALYSIS, MICROSCOPIC (REFLEX): WBC, UA: NONE SEEN WBC/hpf (ref 0–5)

## 2018-02-26 LAB — HCG, QUANTITATIVE, PREGNANCY: HCG, BETA CHAIN, QUANT, S: 148072 m[IU]/mL — AB (ref <5)

## 2018-02-26 LAB — PREGNANCY, URINE: Preg Test, Ur: POSITIVE — AB

## 2018-02-26 MED ORDER — ACETAMINOPHEN 500 MG PO TABS
500.0000 mg | ORAL_TABLET | Freq: Once | ORAL | Status: AC
  Administered 2018-02-26: 500 mg via ORAL
  Filled 2018-02-26: qty 1

## 2018-02-26 MED ORDER — ONDANSETRON 4 MG PO TBDP: 4.0000 mg | ORAL_TABLET | Freq: Three times a day (TID) | ORAL | 0 refills | Status: AC | PRN

## 2018-02-26 NOTE — ED Triage Notes (Signed)
Nausea and dizziness x 2 days.

## 2018-02-26 NOTE — ED Notes (Signed)
Pt verbalized understanding of dc instructions.

## 2018-02-26 NOTE — ED Notes (Signed)
ED Provider at bedside. 

## 2018-02-26 NOTE — ED Notes (Signed)
Pt in US

## 2018-02-26 NOTE — Discharge Instructions (Signed)
It was my pleasure taking care of you today!   Take zofran as needed for nausea.   Call your OBGYN in the morning to schedule a follow up appointment.  Please let them know that you were seen in the ER today and had your ultrasound performed. They may need to request records.   Return to ER for new or worsening symptoms, any additional concerns.
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
## 2018-02-26 NOTE — ED Provider Notes (Signed)
[facility] EMERGENCY DEPARTMENT Provider Note   CSN: [id number] Arrival date & time: 02/26/18  1317     History   Chief Complaint Chief Complaint  Patient presents with  . Nausea  . Dizziness    HPI Kathy Holloway is a 32 y.o. female.  The history is provided by the patient and medical records. No language interpreter was used.  Dizziness  Associated symptoms: nausea and vomiting   Associated symptoms: no diarrhea, no headaches and no weakness    Kathy Holloway is a 32 y.o. female who presents to the Emergency Department complaining of nausea for the last 2 to 3 days.  She has had one episode of emesis this morning.  She also feels very dehydrated and gets dizzy when she stands up quickly.  She reports having right lower abdominal pain only when she coughs or sneezes.  No abdominal pain currently.  No fever or chills.  Denies vaginal discharge, urinary symptoms or vaginal bleeding.  Last menstrual period was mid April.  She does report being about 2 weeks late.  History reviewed. No pertinent past medical history.  Patient Active Problem List   Diagnosis Date Noted  . UTI (lower urinary tract infection) 08/29/2013  . Vomiting 08/29/2013    Past Surgical History:  Procedure Laterality Date  . LEEP       OB History    Gravida  4   Para  1   Term      Preterm  1   AB  1   Living  1     SAB      TAB      Ectopic      Multiple      Live Births               Home Medications    Prior to Admission medications   Medication Sig Start Date End Date Taking? Authorizing Provider  cyclobenzaprine (FLEXERIL) 10 MG tablet Take 1 tablet (10 mg total) by mouth 2 (two) times daily as needed for muscle spasms. 12/16/17   Jacalyn Lefevre, MD  ibuprofen (ADVIL,MOTRIN) 800 MG tablet Take 1 tablet (800 mg total) by mouth 3 (three) times daily. 12/16/17   Jacalyn Lefevre, MD  metoCLOPramide (REGLAN) 10 MG tablet Take 1 tablet (10 mg total) by mouth  every 8 (eight) hours as needed for nausea or vomiting (nausea/headache). 10/10/16   Doug Sou, MD  ondansetron (ZOFRAN ODT) 4 MG disintegrating tablet Take 1 tablet (4 mg total) by mouth every 8 (eight) hours as needed for nausea or vomiting. 02/26/18   Ward, Chase Picket, PA-C  phenazopyridine (PYRIDIUM) 200 MG tablet Take 1 tablet (200 mg total) by mouth 3 (three) times daily as needed for pain. 07/10/15   Melene Plan, DO    Family History No family history on file.  Social History Social History   Tobacco Use  . Smoking status: Never Smoker  . Smokeless tobacco: Never Used  Substance Use Topics  . Alcohol use: No  . Drug use: No     Allergies   Patient has no known allergies.   Review of Systems Review of Systems  Constitutional: Negative for chills and fever.  Gastrointestinal: Positive for abdominal pain, nausea and vomiting. Negative for constipation and diarrhea.  Neurological: Positive for dizziness. Negative for syncope, weakness, numbness and headaches.  All other systems reviewed and are negative.    Physical Exam Updated Vital Signs BP 135/74 (BP Location: Right Arm)  Pulse 81   Temp 98.5 F (36.9 C) (Oral)   Resp 16   Wt 109 kg (240 lb 4.8 oz)   LMP 01/24/2018   SpO2 99%   BMI 41.25 kg/m   Physical Exam  Constitutional: She is oriented to person, place, and time. She appears well-developed and well-nourished. No distress.  HENT:  Head: Normocephalic and atraumatic.  Neck: Neck supple.  Cardiovascular: Normal rate, regular rhythm and normal heart sounds.  No murmur heard. Pulmonary/Chest: Effort normal and breath sounds normal. No respiratory distress.  Abdominal: Soft. She exhibits no distension.  No CVA, flank or abdominal tenderness.  Neurological: She is alert and oriented to person, place, and time.  Skin: Skin is warm and dry.  Nursing note and vitals reviewed.    ED Treatments / Results  Labs (all labs ordered are listed, but  only abnormal results are displayed) Labs Reviewed  URINALYSIS, ROUTINE W REFLEX MICROSCOPIC - Abnormal; Notable for the following components:      Result Value   APPearance CLOUDY (*)    pH 8.5 (*)    Hgb urine dipstick SMALL (*)    All other components within normal limits  PREGNANCY, URINE - Abnormal; Notable for the following components:   Preg Test, Ur POSITIVE (*)    All other components within normal limits  URINALYSIS, MICROSCOPIC (REFLEX) - Abnormal; Notable for the following components:   Bacteria, UA MANY (*)    All other components within normal limits  HCG, QUANTITATIVE, PREGNANCY - Abnormal; Notable for the following components:   hCG, Beta Chain, Quant, S [id number] (*)    All other components within normal limits    EKG None  Radiology US Ob Comp < 14 Wks  Result Date: 02/26/2018 CLINICAL DATA:  32 y/o  F; 3 days of nausea and dizziness. EXAM: OBSTETRIC <14 WK [place] AND TRANSVAGINAL OB US TECHNIQUE: Both transabdominal and transvaginal ultrasound examinations were performed for complete evaluation of the gestation as well as the maternal uterus, adnexal regions, and pelvic cul-de-sac. Transvaginal technique was performed to assess early pregnancy. COMPARISON:  None. FINDINGS: Intrauterine gestational sac: Single Yolk sac:  Visualized. Embryo:  Visualized. Cardiac Activity: Visualized. Heart Rate: 168 bpm CRL:  22 mm   8 w   6 d                  [place] EDC: 10/02/2018 Subchorionic hemorrhage:  Small to moderate subchorionic hemorrhage. Maternal uterus/adnexae: Ovaries not visualized. No free fluid in the pelvis. IMPRESSION: Single live intrauterine pregnancy with estimated gestational age [redacted] weeks and 6 days. Small to moderate subchorionic hemorrhage. Electronically Signed   By: Mitzi Hansen M.D.   On: 02/26/2018 16:46   US Ob Transvaginal  Result Date: 02/26/2018 CLINICAL DATA:  32 y/o  F; 3 days of nausea and dizziness. EXAM: OBSTETRIC <14 WK [place] AND TRANSVAGINAL OB US  TECHNIQUE: Both transabdominal and transvaginal ultrasound examinations were performed for complete evaluation of the gestation as well as the maternal uterus, adnexal regions, and pelvic cul-de-sac. Transvaginal technique was performed to assess early pregnancy. COMPARISON:  None. FINDINGS: Intrauterine gestational sac: Single Yolk sac:  Visualized. Embryo:  Visualized. Cardiac Activity: Visualized. Heart Rate: 168 bpm CRL:  22 mm   8 w   6 d                  [place] EDC: 10/02/2018 Subchorionic hemorrhage:  Small to moderate subchorionic hemorrhage. Maternal uterus/adnexae: Ovaries not visualized. No free fluid in the  pelvis. IMPRESSION: Single live intrauterine pregnancy with estimated gestational age [redacted] weeks and 6 days. Small to moderate subchorionic hemorrhage. Electronically Signed   By: Mitzi HansenLance  Furusawa-Stratton M.D.   On: 02/26/2018 16:46    Procedures Procedures (including critical care time)  Medications Ordered in ED Medications  acetaminophen (TYLENOL) tablet 500 mg (500 mg Oral Given 02/26/18 1625)     Initial Impression / Assessment and Plan / ED Course  I have reviewed the triage vital signs and the nursing notes.  Pertinent labs & imaging results that were available during my care of the patient were reviewed by me and considered in my medical decision making (see chart for details).    Aubrina Theodis ShoveC Lawn is a 32 y.o. female who presents to ED for nausea and vomiting.  She reports having some right lower quadrant abdominal pain only with certain things such as coughing or sneezing.  She does not have any abdominal pain currently and no abdominal tenderness.  UA with negative nitrites and leukocytes, no WBC's.  Her pregnancy test is positive.  Ultrasound obtained showing single live IUP with gestational age estimated at 8 weeks, 6 days.  Patient updated on results.  She will follow-up with her OB/GYN.  Symptomatic home care instructions discussed.  Return precautions discussed and all  questions answered.    Final Clinical Impressions(s) / ED Diagnoses   Final diagnoses:  [redacted] weeks gestation of pregnancy  Nausea    ED Discharge Orders        Ordered    ondansetron (ZOFRAN ODT) 4 MG disintegrating tablet  Every 8 hours PRN     02/26/18 1737       Ward, Chase PicketJaime Pilcher, PA-C 02/26/18 1754    Rolan BuccoBelfi, Melanie, MD 02/26/18 2304

## 2019-01-31 IMAGING — US US OB TRANSVAGINAL
1 series · 14 of 28 positions shown · non-contrast
Comparison: None.

CLINICAL DATA: 32 y/o  F; 3 days of nausea and dizziness.

EXAM:
OBSTETRIC <14 WK US AND TRANSVAGINAL OB US
TECHNIQUE: Both transabdominal and transvaginal ultrasound examinations were
performed for complete evaluation of the gestation as well as the
maternal uterus, adnexal regions, and pelvic cul-de-sac.
Transvaginal technique was performed to assess early pregnancy.

[Series 1: us ob transvaginal · 0.22mm/px · 14 of 40 slices shown]
[im 2/40]
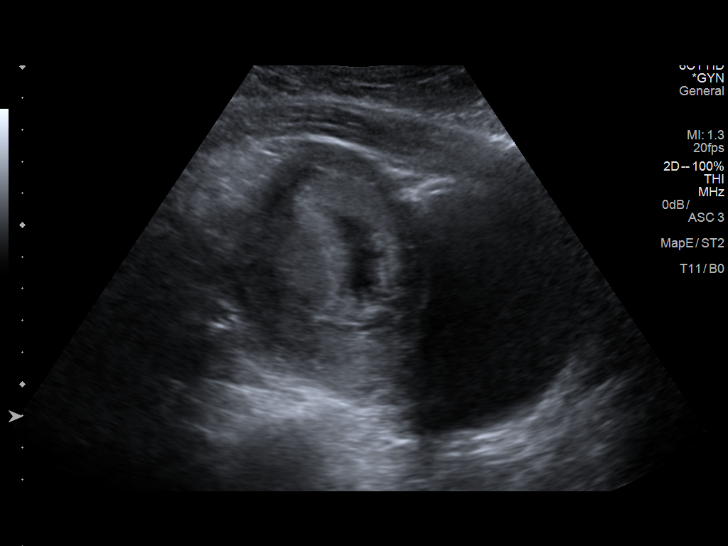
[im 5/40]
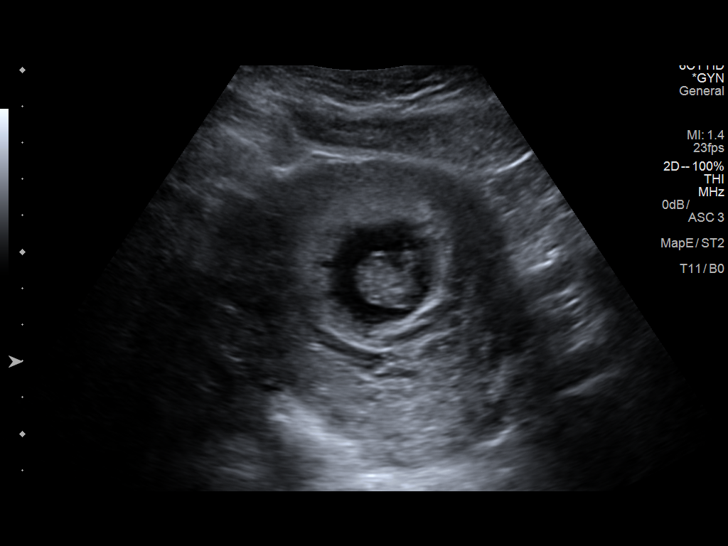
[im 8/40]
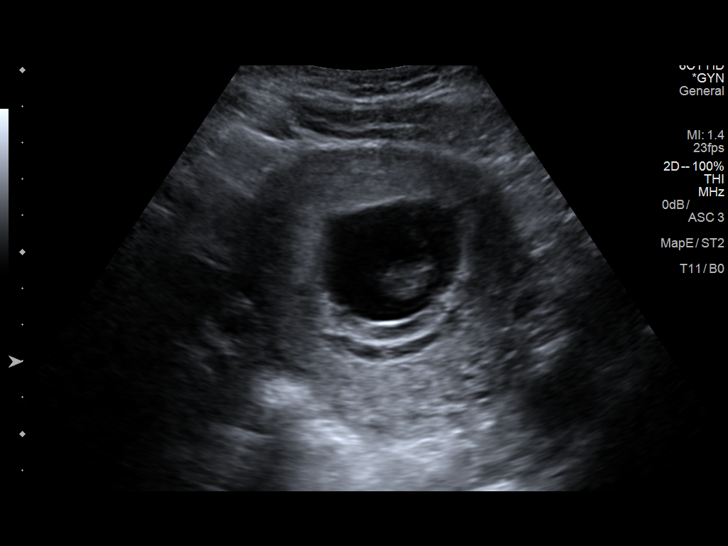
[im 11/40]
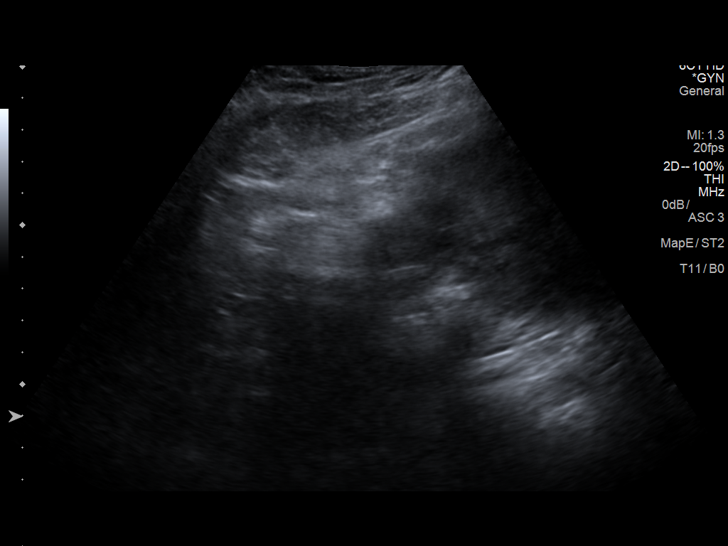
[im 14/40]
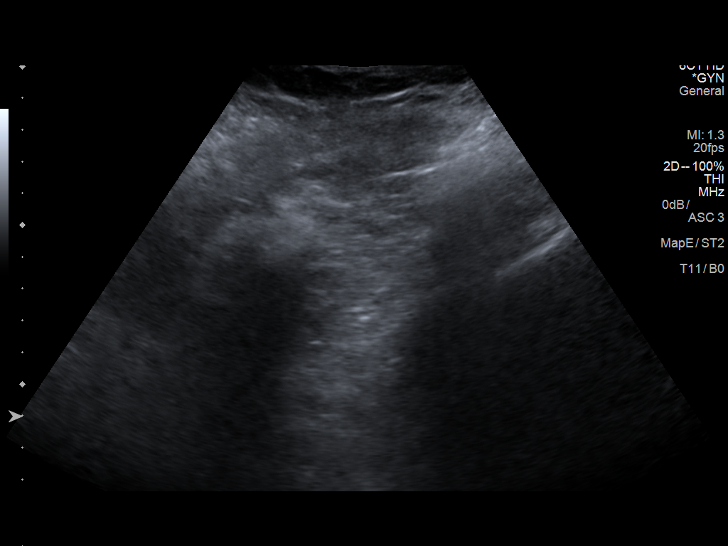
[im 16/40]
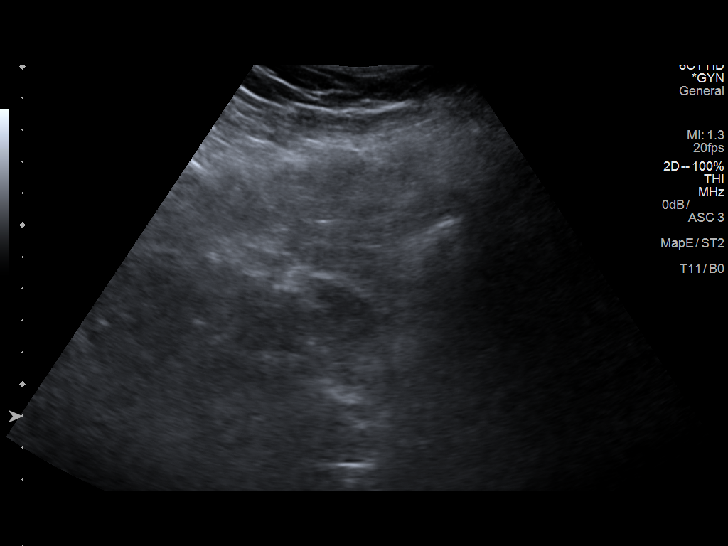
[im 19/40]
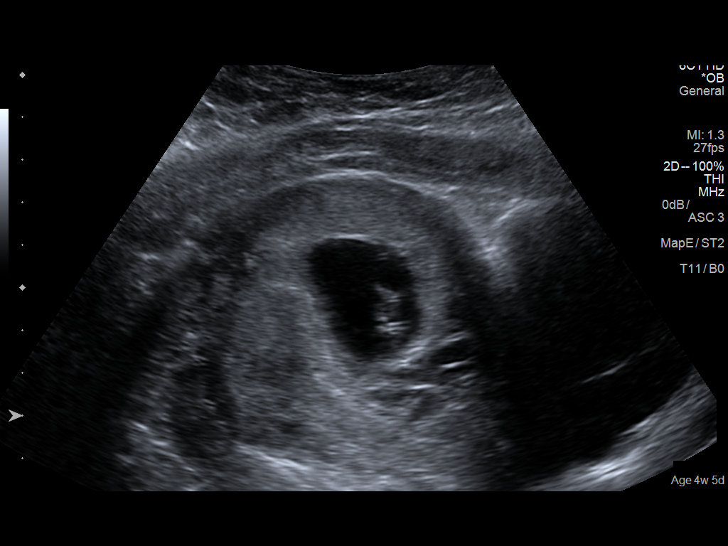
[im 22/40]
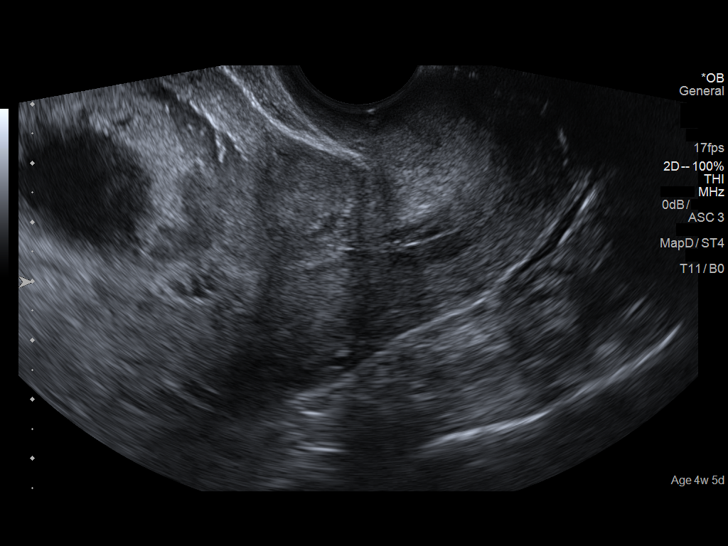
[im 25/40]
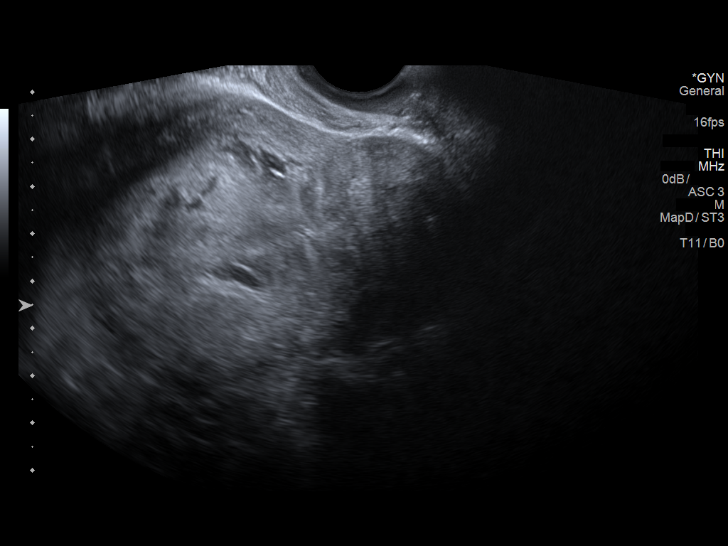
[im 28/40]
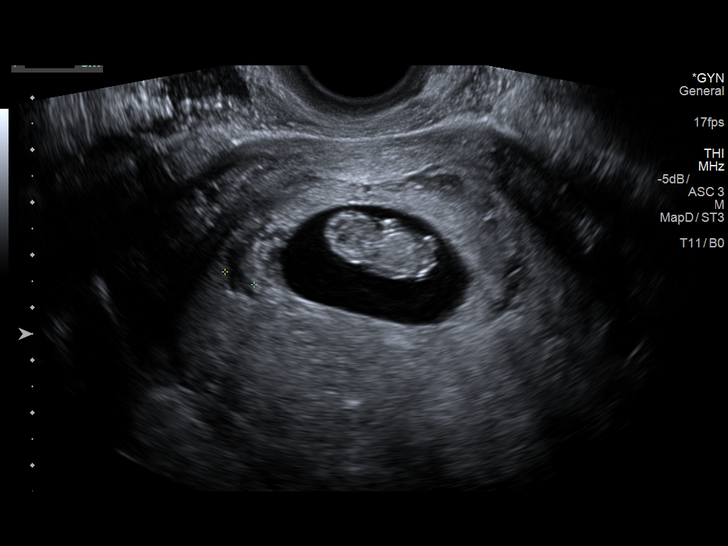
[im 31/40]
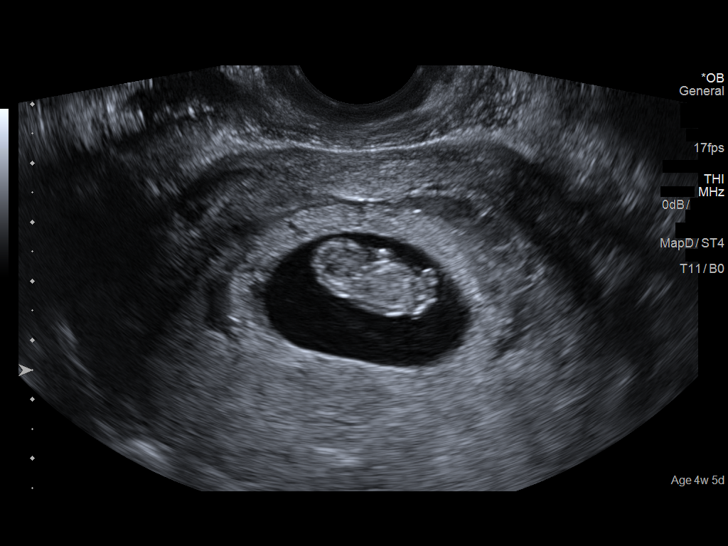
[im 34/40]
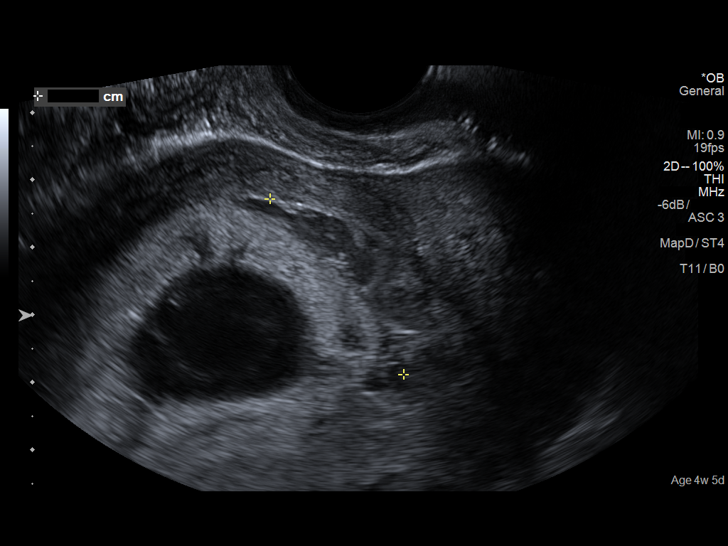
[im 37/40]
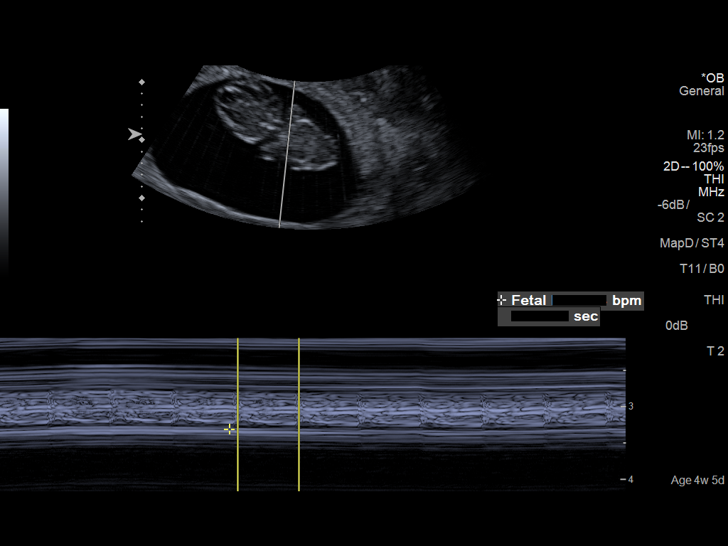
[im 40/40]
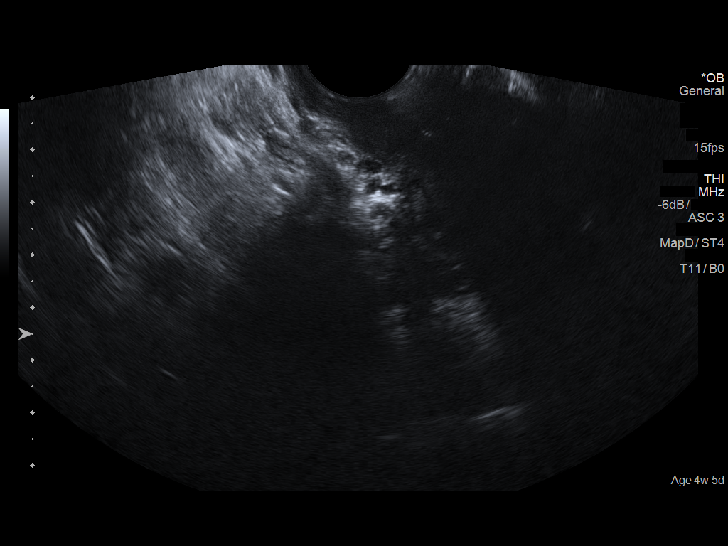

[14 of 28 positions shown; findings below may reference images not displayed]

FINDINGS: Intrauterine gestational sac: Single

Yolk sac:  Visualized.

Embryo:  Visualized.

Cardiac Activity: Visualized.

Heart Rate: 168 bpm

CRL:  22 mm   8 w   6 d                  US EDC: 10/02/2018

Subchorionic hemorrhage:  Small to moderate subchorionic hemorrhage.

Maternal uterus/adnexae: Ovaries not visualized. No free fluid in
the pelvis.
IMPRESSION: Single live intrauterine pregnancy with estimated gestational age 8
weeks and 6 days. Small to moderate subchorionic hemorrhage.

By: Lorenz Jumper M.D.
